# Patient Record
Sex: Male | Born: 1995 | Race: White | Hispanic: No | Marital: Single | Smoking: Never smoker
Health system: Southern US, Community
[De-identification: ages and names within clinical notes are randomized; demographics above are authoritative.]

## PROBLEM LIST (undated history)

## (undated) HISTORY — PX: TONSILLECTOMY: SUR1361

---
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
## 2017-03-09 ENCOUNTER — Other Ambulatory Visit: Payer: Self-pay | Admitting: Family Medicine

## 2017-03-09 ENCOUNTER — Ambulatory Visit (INDEPENDENT_AMBULATORY_CARE_PROVIDER_SITE_OTHER): Payer: Managed Care, Other (non HMO) | Admitting: Family Medicine

## 2017-03-09 ENCOUNTER — Ambulatory Visit (INDEPENDENT_AMBULATORY_CARE_PROVIDER_SITE_OTHER): Payer: Managed Care, Other (non HMO)

## 2017-03-09 ENCOUNTER — Encounter: Payer: Self-pay | Admitting: Family Medicine

## 2017-03-09 VITALS — BP 141/85 | HR 93 | Temp 99.0°F | Ht 71.0 in | Wt 194.0 lb

## 2017-03-09 DIAGNOSIS — R05 Cough: Secondary | ICD-10-CM

## 2017-03-09 DIAGNOSIS — R079 Chest pain, unspecified: Secondary | ICD-10-CM

## 2017-03-09 DIAGNOSIS — K209 Esophagitis, unspecified without bleeding: Secondary | ICD-10-CM

## 2017-03-09 MED ORDER — BENZONATATE 200 MG PO CAPS
200.0000 mg | ORAL_CAPSULE | Freq: Three times a day (TID) | ORAL | 0 refills | Status: DC | PRN
Start: 2017-03-09 — End: 2017-10-19

## 2017-03-09 MED ORDER — OMEPRAZOLE 40 MG PO CPDR: 40.0000 mg | DELAYED_RELEASE_CAPSULE | Freq: Every day | ORAL | 3 refills | Status: DC

## 2017-03-09 MED ORDER — SUCRALFATE 1 G PO TABS: 1.0000 g | ORAL_TABLET | Freq: Four times a day (QID) | ORAL | 0 refills | Status: DC

## 2017-03-09 NOTE — Progress Notes (Signed)
Raymond Steele is a 21 y.o. male who [name] today for chest pain.  Raymond Steele notes I 1 week history of cough and congestion.  He notes a several day history of central chest pain worse with swallowing.  He denies any significant exertional component or vomiting.  He denies any blood in sputum or vomitus.  He denies any dark tarry stools.  He denies fevers or chills or trouble breathing.  He feels well otherwise.  Tried some over-the-counter medications which have not helped much.   No past medical history on file. Past Surgical History:  Procedure Laterality Date  . TONSILLECTOMY     Social History   Tobacco Use  . Smoking status: Never Smoker  . Smokeless tobacco: Never Used  Substance Use Topics  . Alcohol use: No    Alcohol/week: 0.0 oz   family history includes Migraines in his mother.  ROS as above:  Medications: Current Outpatient Medications  Medication Sig Dispense Refill  . benzonatate (TESSALON) 200 MG capsule Take 1 capsule (200 mg total) by mouth 3 (three) times daily as needed for cough. 45 capsule 0  . omeprazole (PRILOSEC) 40 MG capsule Take 1 capsule (40 mg total) by mouth daily. 30 capsule 3  . sucralfate (CARAFATE) 1 g tablet Take 1 tablet (1 g total) by mouth 4 (four) times daily. 120 tablet 0   No current facility-administered medications for this visit.    Allergies  Allergen Reactions  . Zithromax [Azithromycin]     Health Maintenance Health Maintenance  Topic Date Due  . HIV Screening  02/26/2012  . TETANUS/TDAP  02/26/2016  . INFLUENZA VACCINE  10/26/2016     Exam:  BP (!) 141/85   Pulse 93   Temp 99 F (37.2 C) (Oral)   Ht 5\' 11"  (1.803 m)   Wt 194 lb (88 kg)   SpO2 95%   BMI 27.06 kg/m  Gen: Well NAD HEENT: EOMI,  MMM posterior pharynx with shallow ulcerations.  No cervical lymphadenopathy.   No subcutaneous crepitations in the neck or chest. Lungs: Normal work of breathing. CTABL Heart: RRR no MRG Abd: NABS, Soft. Nondistended, Nontender Exts: Brisk capillary refill, warm and well perfused.   Twelve-lead EKG shows normal sinus rhythm at 78 beats per minutes.  No ST segment elevation or depression.  Normal EKG.  Patient was given a GI cocktail and felt better.   No results found for this or any previous visit (from the past 72 hour(s)). Dg Chest 2 View  Result Date: 03/09/2017 CLINICAL DATA:  Central chest pain, cough for 5 days EXAM: CHEST  2 VIEW COMPARISON:  None. FINDINGS: Heart and mediastinal contours are within normal limits. No focal opacities or effusions. No acute bony abnormality. IMPRESSION: No active cardiopulmonary disease. Electronically Signed   By: Charlett NoseKevin  Dover M.D.   On: 03/09/2017 11:03      Assessment and Plan: 21 y.o. male with central chest pain worse with swallowing with ulcers present at the posterior pharynx is concerning for esophagitis possibly due to CMV.  Plan to check basic labs listed below and treat empirically with omeprazole and Carafate.  Will control cough with Tessalon Perles.  EKG and chest x-ray are unremarkable.   Orders Placed This Encounter  Procedures  . DG Chest 2 View    Order Specific Question:   Reason for exam:    Answer:   Central chest pain with swallow  and cough    Order Specific Question:   Preferred imaging [facility]?    [facility]  . COMPLETE METABOLIC PANEL WITH GFR  . CMV abs, IgG+IgM (cytomegalovirus)  . Epstein-Barr virus VCA antibody panel  . CBC with Differential/Platelet   Meds ordered this encounter  Medications  . omeprazole (PRILOSEC) 40 MG capsule    Sig: Take 1 capsule (40 mg total) by mouth daily.    Dispense:  30 capsule    Refill:  3  . sucralfate (CARAFATE) 1 g tablet    Sig: Take 1 tablet (1 g total) by mouth 4 (four) times daily.    Dispense:  120 tablet    Refill:   0  . benzonatate (TESSALON) 200 MG capsule    Sig: Take 1 capsule (200 mg total) by mouth 3 (three) times daily as needed for cough.    Dispense:  45 capsule    Refill:  0     Discussed warning signs or symptoms. Please see discharge instructions. Patient expresses understanding.

## 2017-03-09 NOTE — Patient Instructions (Addendum)
Thank you for coming in today. Get labs today.  Take omeprazole for acid suppression.  Take carafate 4x daily to coat your stomach.  Take tessalon pearles for cough as needed. Get labs now.   Recheck as needed.    Esophagitis Esophagitis is inflammation of the esophagus. The esophagus is the tube that carries food and liquids from your mouth to your stomach. Esophagitis can cause soreness or pain in the esophagus. This condition can make it difficult and painful to swallow. What are the causes? Most causes of esophagitis are not serious. Common causes of this condition include:  Gastroesophageal reflux disease (GERD). This is when stomach contents move back up into the esophagus (reflux).  Repeated vomiting.  An allergic-type reaction, especially caused by food allergies (eosinophilic esophagitis).  Injury to the esophagus by swallowing large pills with or without water, or swallowing certain types of medicines.  Swallowing (ingesting) harmful chemicals, such as household cleaning products.  Heavy alcohol use.  An infection of the esophagus.This most often occurs in people who have a weakened immune system.  Radiation or chemotherapy treatment for cancer.  Certain diseases such as sarcoidosis, Crohn disease, and scleroderma.  What are the signs or symptoms? Symptoms of this condition include:  Difficult or painful swallowing.  Pain with swallowing acidic liquids, such as citrus juices.  Pain with burping.  Chest pain.  Difficulty breathing.  Nausea.  Vomiting.  Pain in the abdomen.  Weight loss.  Ulcers in the mouth.  Patches of white material in the mouth (candidiasis).  Fever.  Coughing up blood or vomiting blood.  Stool that is black, tarry, or bright red.  How is this diagnosed? Your health care provider will take a medical history and perform a physical exam. You may also have other tests, including:  An endoscopy to examine your stomach and  esophagus with a small camera.  A test that measures the acidity level in your esophagus.  A test that measures how much pressure is on your esophagus.  A barium swallow or modified barium swallow to show the shape, size, and functioning of your esophagus.  Allergy tests.  How is this treated? Treatment for this condition depends on the cause of your esophagitis. In some cases, steroids or other medicines may be given to help relieve your symptoms or to treat the underlying cause of your condition. You may have to make some lifestyle changes, such as:  Avoiding alcohol.  Quitting smoking.  Changing your diet.  Exercising.  Changing your sleep habits and your sleep environment.  Follow these instructions at home: Take these actions to decrease your discomfort and to help avoid complications. Diet  Follow a diet as recommended by your health care provider. This may involve avoiding foods and drinks such as: ? Coffee and tea (with or without caffeine). ? Drinks that contain alcohol. ? Energy drinks and sports drinks. ? Carbonated drinks or sodas. ? Chocolate and cocoa. ? Peppermint and mint flavorings. ? Garlic and onions. ? Horseradish. ? Spicy and acidic foods, including peppers, chili powder, curry powder, vinegar, hot sauces, and barbecue sauce. ? Citrus fruit juices and citrus fruits, such as oranges, lemons, and limes. ? Tomato-based foods, such as red sauce, chili, salsa, and pizza with red sauce. ? Fried and fatty foods, such as donuts, french fries, potato chips, and high-fat dressings. ? High-fat meats, such as hot dogs and fatty cuts of red and white meats, such as rib eye steak, sausage, ham, and bacon. ? High-fat dairy  items, such as whole milk, butter, and cream cheese.  Eat small, frequent meals instead of large meals.  Avoid drinking large amounts of liquid with your meals.  Avoid eating meals during the 2-3 hours before bedtime.  Avoid lying down right  after you eat.  Do not exercise right after you eat.  Avoid foods and drinks that seem to make your symptoms worse. General instructions  Pay attention to any changes in your symptoms.  Take over-the-counter and prescription medicines only as told by your health care provider. Do not take aspirin, ibuprofen, or other NSAIDs unless your health care provider told you to do so.  If you have trouble taking pills, use a pill splitter to decrease the size of the pill. This will decrease the chance of the pill getting stuck or injuring your esophagus on the way down. Also, drink water after you take a pill.  Do not use any tobacco products, including cigarettes, chewing tobacco, and e-cigarettes. If you need help quitting, ask your health care provider.  Wear loose-fitting clothing. Do not wear anything tight around your waist that causes pressure on your abdomen.  Raise (elevate) the head of your bed about 6 inches (15 cm).  Try to reduce your stress, such as with yoga or meditation. If you need help reducing stress, ask your health care provider.  If you are overweight, reduce your weight to an amount that is healthy for you. Ask your health care provider for guidance about a safe weight loss goal.  Keep all follow-up visits as told by your health care provider. This is important. Contact a health care provider if:  You have new symptoms.  You have unexplained weight loss.  You have difficulty swallowing, or it hurts to swallow.  You have wheezing or a persistent cough.  Your symptoms do not improve with treatment.  You have frequent heartburn for more than two weeks. Get help right away if:  You have severe pain in your arms, neck, jaw, teeth, or back.  You feel sweaty, dizzy, or light-headed.  You have chest pain or shortness of breath.  You vomit and your vomit looks like blood or coffee grounds.  Your stool is bloody or black.  You have a fever.  You cannot swallow,  drink, or eat. This information is not intended to replace advice given to you by your health care provider. Make sure you discuss any questions you have with your health care provider. Document Released: 04/21/2004 Document Revised: 08/20/2015 Document Reviewed: 07/09/2014 Elsevier Interactive Patient Education  [employer]2018 [employer]

## 2017-03-11 LAB — CBC WITH DIFFERENTIAL/PLATELET
Basophils Absolute: 0.1 10*3/uL (ref 0.0–0.2)
Basos: 1 %
EOS (ABSOLUTE): 0.1 10*3/uL (ref 0.0–0.4)
Eos: 1 %
HEMATOCRIT: 44.5 % (ref 37.5–51.0)
Hemoglobin: 14.8 g/dL (ref 13.0–17.7)
Immature Grans (Abs): 0.1 10*3/uL (ref 0.0–0.1)
Immature Granulocytes: 1 %
LYMPHS ABS: 2.7 10*3/uL (ref 0.7–3.1)
Lymphs: 24 %
MCH: 29.2 pg (ref 26.6–33.0)
MCHC: 33.3 g/dL (ref 31.5–35.7)
MCV: 88 fL (ref 79–97)
MONOS ABS: 1.2 10*3/uL — AB (ref 0.1–0.9)
Monocytes: 11 %
Neutrophils Absolute: 7.2 10*3/uL — ABNORMAL HIGH (ref 1.4–7.0)
Neutrophils: 62 %
Platelets: 266 10*3/uL (ref 150–379)
RBC: 5.06 x10E6/uL (ref 4.14–5.80)
RDW: 14 % (ref 12.3–15.4)
WBC: 11.3 10*3/uL — AB (ref 3.4–10.8)

## 2017-03-11 LAB — COMPREHENSIVE METABOLIC PANEL
A/G RATIO: 1.7 (ref 1.2–2.2)
ALT: 23 IU/L (ref 0–44)
AST: 23 IU/L (ref 0–40)
Albumin: 4.4 g/dL (ref 3.5–5.5)
Alkaline Phosphatase: 77 IU/L (ref 39–117)
BUN / CREAT RATIO: 10 (ref 9–20)
BUN: 9 mg/dL (ref 6–20)
Bilirubin Total: 0.6 mg/dL (ref 0.0–1.2)
CALCIUM: 9.3 mg/dL (ref 8.7–10.2)
CO2: 24 mmol/L (ref 20–29)
Chloride: 102 mmol/L (ref 96–106)
Creatinine, Ser: 0.94 mg/dL (ref 0.76–1.27)
GFR calc non Af Amer: 116 mL/min/{1.73_m2} (ref >59)
GFR, EST AFRICAN AMERICAN: 134 mL/min/{1.73_m2} (ref >59)
GLOBULIN, TOTAL: 2.6 g/dL (ref 1.5–4.5)
Glucose: 75 mg/dL (ref 65–99)
Potassium: 4.4 mmol/L (ref 3.5–5.2)
SODIUM: 142 mmol/L (ref 134–144)
TOTAL PROTEIN: 7 g/dL (ref 6.0–8.5)

## 2017-03-11 LAB — CMV ANTIBODY, IGG (EIA): CMV Ab - IgG: <0.6 U/mL (ref 0.00–0.59)

## 2017-03-11 LAB — SPECIMEN STATUS REPORT

## 2017-03-11 LAB — EBV VCA/EA AB, IGG
EBV Early Antigen Ab, IgG: <9 U/mL (ref 0.0–8.9)
EBV VCA IgG: 292 U/mL — ABNORMAL HIGH (ref 0.0–17.9)

## 2017-03-11 LAB — CMV IGM: CMV IgM Ser EIA-aCnc: <30 AU/mL (ref 0.0–29.9)

## 2017-03-17 NOTE — Addendum Note (Signed)
Addended by: Collie SiadICHARDSON, Lively Haberman M on: 03/17/2017 09:40 AM   Modules accepted: Orders

## 2017-10-19 ENCOUNTER — Encounter: Payer: Self-pay | Admitting: Family Medicine

## 2017-10-19 ENCOUNTER — Ambulatory Visit (INDEPENDENT_AMBULATORY_CARE_PROVIDER_SITE_OTHER): Payer: Managed Care, Other (non HMO)

## 2017-10-19 ENCOUNTER — Ambulatory Visit (INDEPENDENT_AMBULATORY_CARE_PROVIDER_SITE_OTHER): Payer: Managed Care, Other (non HMO) | Admitting: Family Medicine

## 2017-10-19 VITALS — BP 142/86 | HR 97 | Wt 205.0 lb

## 2017-10-19 DIAGNOSIS — Z23 Encounter for immunization: Secondary | ICD-10-CM | POA: Diagnosis not present

## 2017-10-19 DIAGNOSIS — S63091A Other subluxation of right wrist and hand, initial encounter: Secondary | ICD-10-CM | POA: Diagnosis not present

## 2017-10-19 DIAGNOSIS — M25531 Pain in right wrist: Secondary | ICD-10-CM | POA: Diagnosis not present

## 2017-10-19 MED ORDER — DICLOFENAC SODIUM 1 % TD GEL: 2.0000 g | Freq: Four times a day (QID) | TRANSDERMAL | 11 refills | Status: AC

## 2017-10-19 NOTE — Patient Instructions (Addendum)
Thank you for coming in today. Use a wrist compression.  I recommend Body Helix full wrist.  You can also get a wrist wrap at a sporting good store or a medical supply store.   Use the diclofenac gel.   Recheck in 1 month.  Return sooner if needed.      Extensor Carpi Ulnaris Instability Tendons attach muscles to bones. They also help with joint movements. When a tendon is injured or it moves out of its normal position, the joint movements can be affected. The extensor carpi ulnaris (ECU) tendon is located on the back of the wrist, on the side that is closest to the little finger. Itruns under a band of fibrous tissue (extensor retinaculum) to stabilize its position in the wrist. This tendonhelps with extending or bending back your wrist and angling your wrist toward your body. It also helps with gripping and pulling items close to your body. Injury to the ECU may result in ECU instability and hand and wrist weakness. Damage to the retinaculum causes the ECU tendon to slip in and out of the bony groove (subluxation) that it normally lies in or to come out of the groove completely (dislocation). What are the causes? This condition often happens with repeated activities that require forceful rotation or extension of the wrist. Other causes of ECU instability include:  Forced (traumatic) rotation of the wrist to a palm-up position.  A shallow or malformed groove in the bone where the ECU normally lies.  What increases the risk? This condition is more likely to develop in:  People who play sports that include repeated, forceful turning up of the wrist into a palm-up position, such as tennis, golf, and weight lifting.  People who have had a previous wrist injury.  People who have poor wrist strength and flexibility.  People who do not warm up properly before activities.  What are the signs or symptoms? Symptoms of this condition include:  Painful or painless "snapping" feeling over  the back of the wrist-on the little finger side-when you rotate your wrist into the palm-up position.  Swelling of your injured wrist.  Pain when the injured area is touched.  Weakness in your wrist when you turn it to the palm-up position.  How is this diagnosed? This condition is diagnosed with a medical history and physical exam. You may also have imaging studies to confirm the diagnosis. These can include:  Ultrasound.  MRI.  How is this treated? Treatment may include the use of icing and medicines to reduce pain and swelling.You may also be advised to wear a splint or brace to limit your wrist motion. In less severe cases, treatment may also include working with a physical therapist to strengthen your wrist and stabilize your ECU. In severe cases, surgery may be needed. Follow these instructions at home: If you have a splint or brace:  Wear it as told by your health care provider. Remove it only as told by your health care provider.  Loosen the splint or brace if your fingers become numb and tingle, or if they turn cold and blue.  Keep the splint or brace clean and dry. Managing pain, stiffness, and swelling  If directed, apply ice to the injured area. ? Put ice in a plastic bag. ? Place a towel between your skin and the bag. ? Leave the ice on for 20 minutes, 2-3 times per day.  Move your fingers often to avoid stiffness and to lessen swelling.  Raise (elevate)  the injured area above the level of your heart while you are sitting or lying down. General instructions  Return to your normal activities as told by your health care provider. Ask your health care provider what activities are safe for you.  Take over-the-counter and prescription medicines only as told by your health care provider.  Keep all follow-up visits as told by your health care provider. This is important.  Do not drive or operate heavy machinery while taking prescription pain medicine. Contact a  health care provider if:  Your pain, tenderness, or swelling gets worse, even if you have had treatment.  You have numbness or tingling in your wrist, hand, or fingers on the injured side. This information is not intended to replace advice given to you by your health care provider. Make sure you discuss any questions you have with your health care provider. Document Released: 03/14/2005 Document Revised: 08/20/2015 Document Reviewed: 05/16/2014 Elsevier Interactive Patient Education  2018 [employer].   Extensor Carpi Ulnaris Tendinitis Rehab Ask your health care provider which exercises are safe for you. Do exercises exactly as told by your health care provider and adjust them as directed. It is normal to feel mild stretching, pulling, tightness, or discomfort as you do these exercises, but you should stop right away if you feel sudden pain or your pain gets worse. Do not begin these exercises until told by your health care provider. Stretching and range of motion exercises These exercises warm up your muscles and joints and improve the movement and flexibility of your forearm. These exercises also help to relieve pain, numbness, and tingling. Exercise A: Extensor stretch 1. Extend your __________ arm in front of you, and point your fingers downward. 2. Gently pull the palm of your __________ hand toward you until you feel a gentle stretch on the top of your forearm and wrist. 3. Hold this position for __________ seconds. 4. Slowly return to the starting position. Repeat __________ times with your elbow straight and __________ times with your elbow bent. Complete this exercises __________ times a day. Exercise B: Wrist flexor stretch  1. Stand over a tabletop with your __________ hand resting on the tabletop and your fingers pointing away from your body. Your arm should be extended, and there should be a slight bend in your elbow. 2. Gently press the back of your hand down onto the table by  straightening your elbow. You should feel a stretch in the top of your forearm. 3. Hold this position for __________ seconds. 4. Slowly return to the starting position. Repeat __________ times. Complete this exercise __________ times a day. Strengthening exercises These exercises build strength and endurance in your forearm. Endurance is the ability to use your muscles for a long time, even after they get tired. Exercise C: Wrist extension 1. Sit with your __________ forearm supported on a table and your hand resting palm-down over the edge of the table. 2. Hold a __________ weight in your __________ hand. Or, hold a rubber exercise band or tube in both hands. If you are holding a band or tube, take up any slack with your other hand so there is a slight tension in the exercise band or tube when you start. 3. Slowly move the back of your hand up toward your forearm. 4. Hold this position for __________ seconds. 5. Slowly lower your hand to the starting position. Repeat __________ times. Complete this exercise __________ times a day. Exercise D: Ulnar deviation 1. Sit with your __________  forearm supported. Your thumb should be pointing upward, and your hand should be able to move down over the table edge. 2. Hold your __________ arm in front of you and hold a rubber exercise band or tube between your hands. There should be a slight tension in the exercise band or tube when you start. 3. Move your injured wrist so your pinkie travels toward the floor. Try to only move your hand and wrist and keep the rest of your arm still. 4. Hold this position for __________ seconds. 5. Slowly return your wrist to the starting position. Repeat __________ times. Complete this exercise __________ times a day. Exercise E: Ulnar deviation, eccentric 1. Sit with your __________ forearm supported. Your thumb should be pointing upward, and your hand should be able to move down over the table edge. 2. Hold your  __________ arm in front of you and hold a rubber exercise band or tube between your hands. Do not put any tension on the exercise band or tube yet. 3. Move your __________ wrist so your pinkie travels toward the floor. 4. Add tension to the band or tube by pulling it with your __________ hand. 5. Hold this position for __________ seconds. 6. Slowly return to the starting position, controlling the speed with your __________ hand and wrist. Your hand will move toward the ceiling, thumb first. Try to move only your hand and wrist and keep the rest of your arm still. Repeat __________ times. Complete this exercise __________ times a day. This information is not intended to replace advice given to you by your health care provider. Make sure you discuss any questions you have with your health care provider. Document Released: 03/14/2005 Document Revised: 11/17/2015 Document Reviewed: 11/28/2014 Elsevier Interactive Patient Education  [employer]2018 [employer].

## 2017-10-20 ENCOUNTER — Encounter: Payer: Self-pay | Admitting: Family Medicine

## 2017-10-20 DIAGNOSIS — S63091A Other subluxation of right wrist and hand, initial encounter: Secondary | ICD-10-CM | POA: Insufficient documentation

## 2017-10-20 NOTE — Progress Notes (Signed)
Raymond Steele is a 22 y.o. male who [facility] today for right wrist pain.  Raymond MaduroRobert notes an approximate 6-week history of right wrist pain.  He is currently working as a Public relations account executivelifeguard but cannot think of any injury or significant activity change that would cause the wrist pain.  He points to the dorsal ulnar wrist as the area of maximal pain and notes that wrist extension is painful.  He notes that he is unable to do a push-up position and have force across his extended hand as it is painful.  He denies any radiating pain weakness or numbness fevers or chills.  He has tried some oral ibuprofen like medications which did not help much.  He is tried using an over-the-counter wrist brace but notes is not very helpful because he cannot use it at work.  No fevers or chills nausea vomiting or diarrhea.    ROS:  As above  Exam:  BP (!) 142/86   Pulse 97   Wt 205 lb (93 kg)   BMI 28.59 kg/m  General: Well Developed, well nourished, and in no acute distress.  Neuro/Psych: Alert and oriented x3, extra-ocular muscles intact, able to move all 4 extremities, sensation grossly intact. Skin: Warm and dry, no rashes noted.  Respiratory: Not using accessory muscles, speaking in full sentences, trachea midline.  Cardiovascular: Pulses palpable, no extremity edema. Abdomen: Does not appear distended. MSK:  Right wrist normal-appearing without deformity erythema or ecchymosis. Tender to palpation along the ulnar styloid. Wrist motion is intact but patient has pain with wrist extension and ulnar deviation. Strength is intact again pain is worsened with resisted ulnar deviation and wrist extension. Painful palpable click and snap of the extensor carpi ulnaris tendon across ulnar styloid present with some wrist motions. Pulses capillary refill and sensation are intact distally.    Lab and Radiology Results No results found for this or any  previous visit (from the past 72 hour(s)). Dg Wrist Complete Right  Result Date: 10/19/2017 CLINICAL DATA:  Ulnar wrist pain for several weeks, no known injury, initial encounter EXAM: RIGHT WRIST - COMPLETE 3+ VIEW COMPARISON:  None. FINDINGS: There is no evidence of fracture or dislocation. There is no evidence of arthropathy or other focal bone abnormality. Soft tissues are unremarkable. IMPRESSION: No acute abnormality noted. Electronically Signed   By: Alcide CleverMark  Lukens M.D.   On: 10/19/2017 16:23  I personally (independently) visualized and performed the interpretation of the images attached in this note.      Assessment and Plan: 22 y.o. 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  Plan for diclofenac gel home exercise program and compressive wrist sleeve.  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  Recheck in a few weeks.  Return sooner if needed.  Tdap given prior to discharge.  Blood pressure elevated today.  Follow-up in the near future to address blood pressure.    Orders Placed This Encounter  Procedures  . DG Wrist Complete Right    Standing Status:   Future    Number of Occurrences:   1    Standing Expiration Date:   12/21/2018    Order Specific Question:   Reason for Exam (SYMPTOM  OR DIAGNOSIS REQUIRED)    Answer:   WRIST PAIN X3 WEEKS    Order Specific Question:   Preferred imaging location?    [facility]    Order Specific Question:  Radiology Contrast Protocol - do NOT remove file path    Answer:   [email or web address]  . Tdap vaccine greater than or equal to 7yo IM   Meds ordered this encounter  Medications  . diclofenac sodium (VOLTAREN) 1 % GEL    Sig: Apply 2 g topically 4 (four) times daily. To affected joint.    Dispense:  100 g    Refill:  11    Historical information moved to improve visibility of documentation.  History reviewed. No pertinent past  medical history. Past Surgical History:  Procedure Laterality Date  . TONSILLECTOMY     Social History   Tobacco Use  . Smoking status: Never Smoker  . Smokeless tobacco: Never Used  Substance Use Topics  . Alcohol use: No    Alcohol/week: 0.0 oz   family history includes Migraines in his mother.  Medications: Current Outpatient Medications  Medication Sig Dispense Refill  . cimetidine (TAGAMET) 400 MG tablet Take 400 mg by mouth 3 (three) times daily.    . diclofenac sodium (VOLTAREN) 1 % GEL Apply 2 g topically 4 (four) times daily. To affected joint. 100 g 11   No current facility-administered medications for this visit.    Allergies  Allergen Reactions  . Zithromax [Azithromycin]       Discussed warning signs or symptoms. Please see discharge instructions. Patient expresses understanding.

## 2017-11-21 ENCOUNTER — Ambulatory Visit: Payer: Managed Care, Other (non HMO) | Admitting: Family Medicine

## 2019-02-19 IMAGING — DX DG WRIST COMPLETE 3+V*R*
4 series · 4 of 4 positions shown · non-contrast
Comparison: None.

CLINICAL DATA: Ulnar wrist pain for several weeks, no known injury,
initial encounter

EXAM:
RIGHT WRIST - COMPLETE 3+ VIEW

[wrist pa]
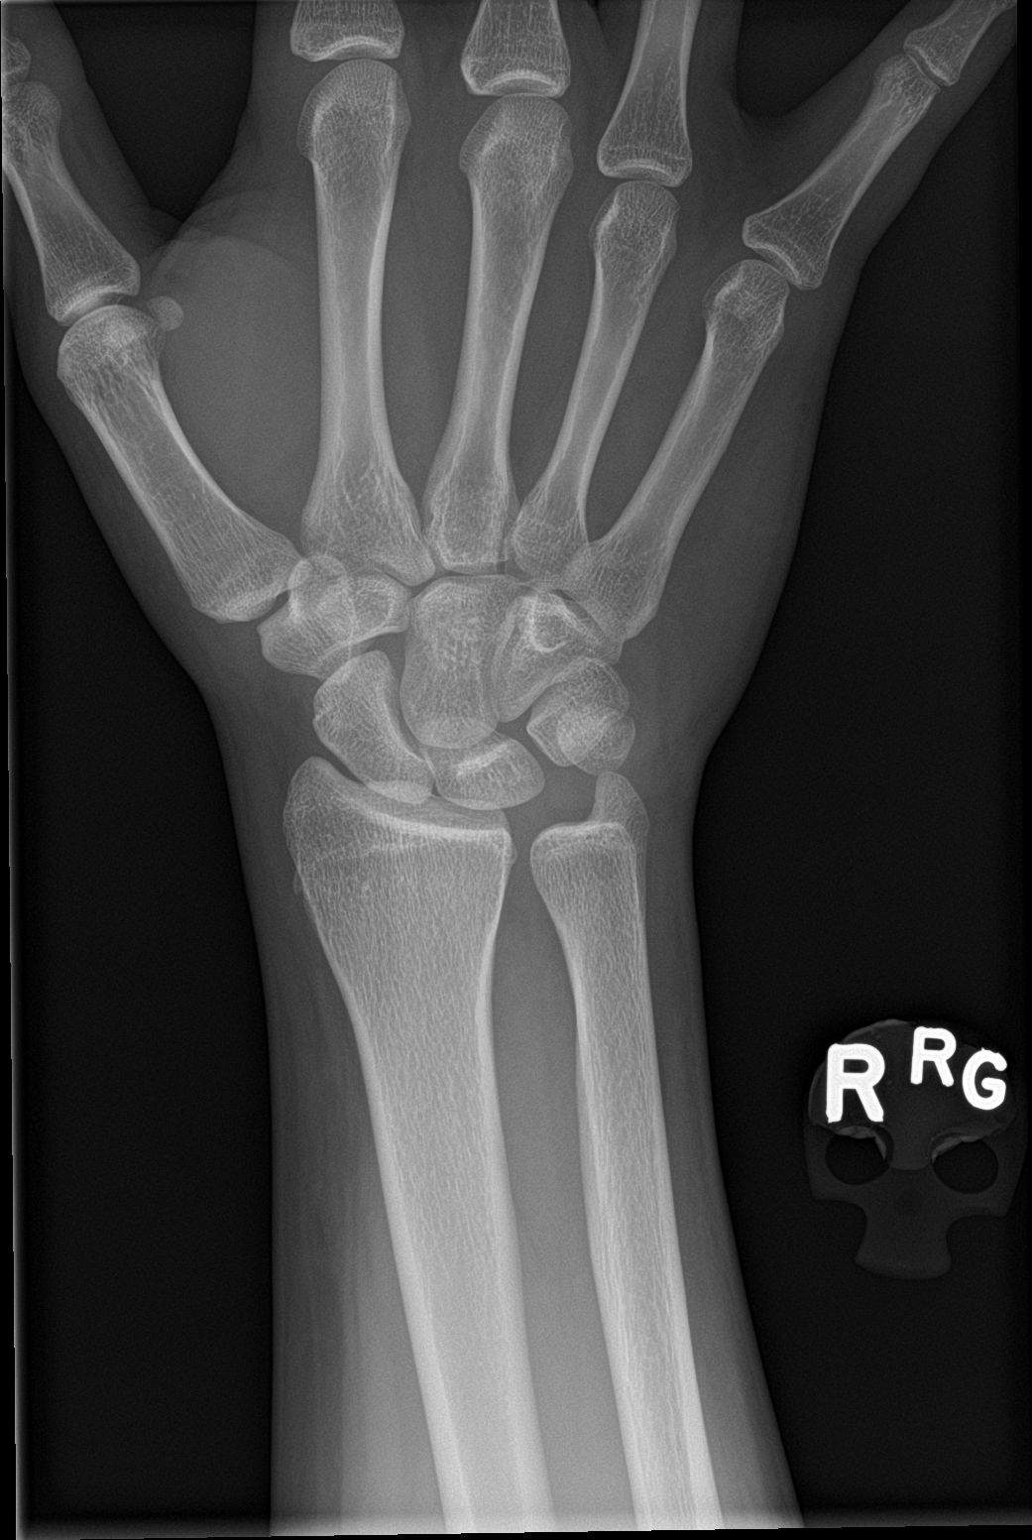

[wrist obl]
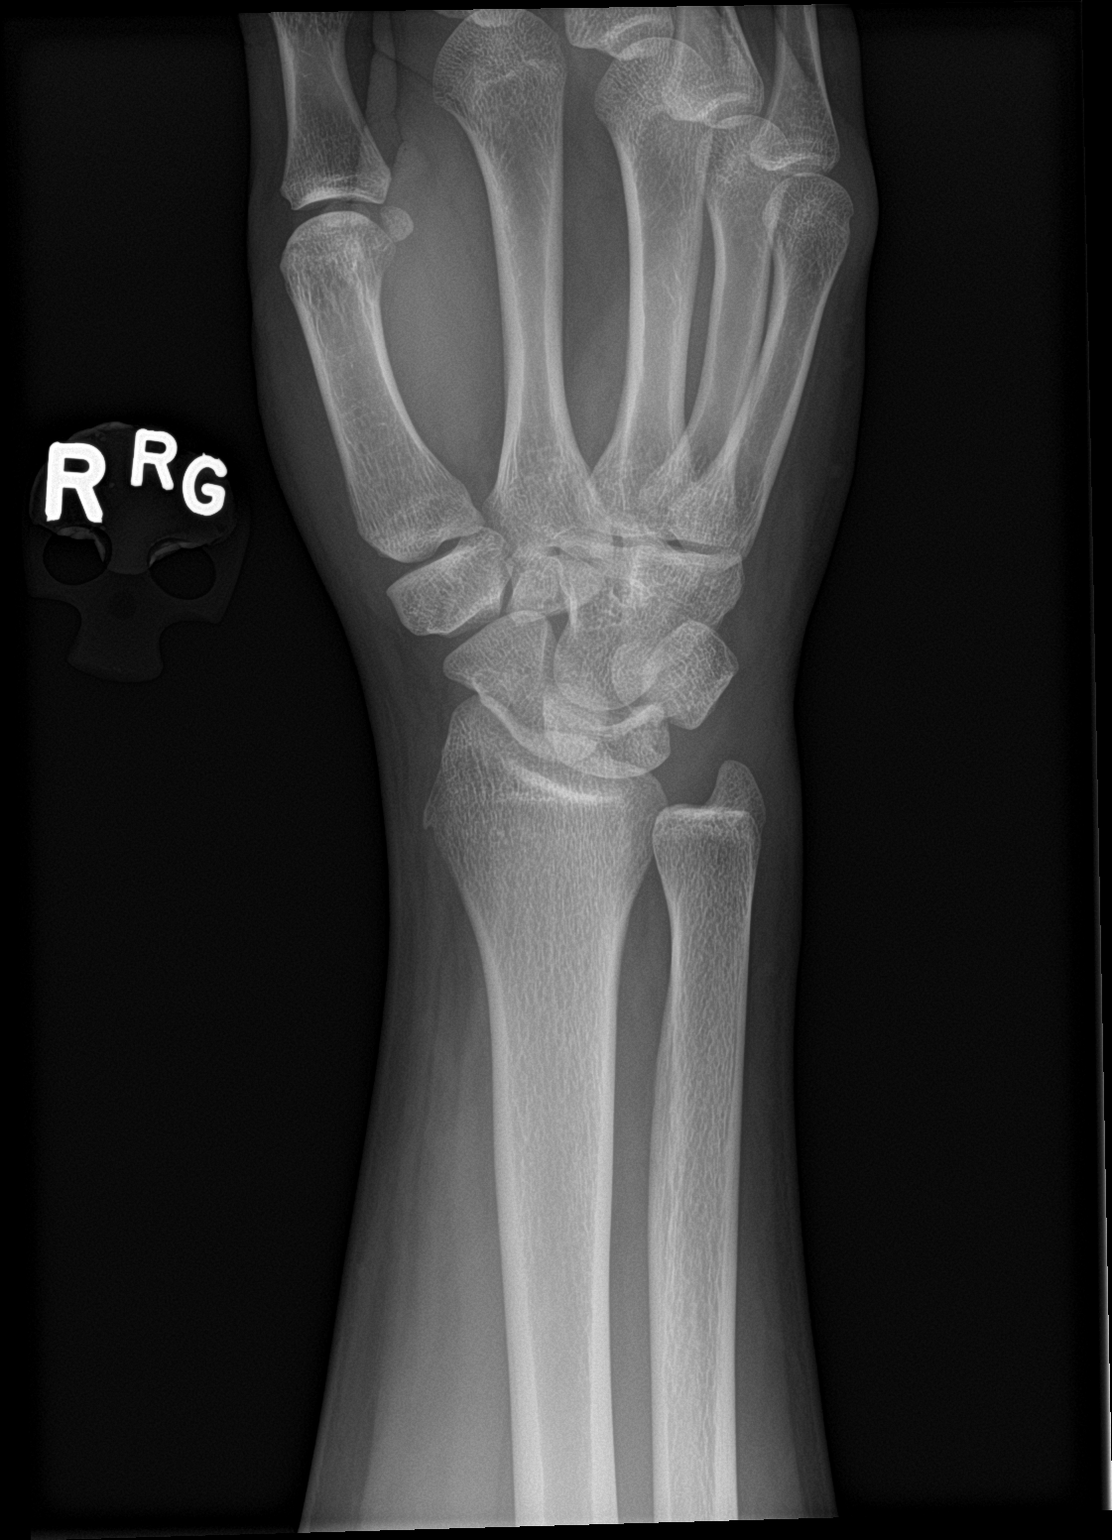

[wrist lat]
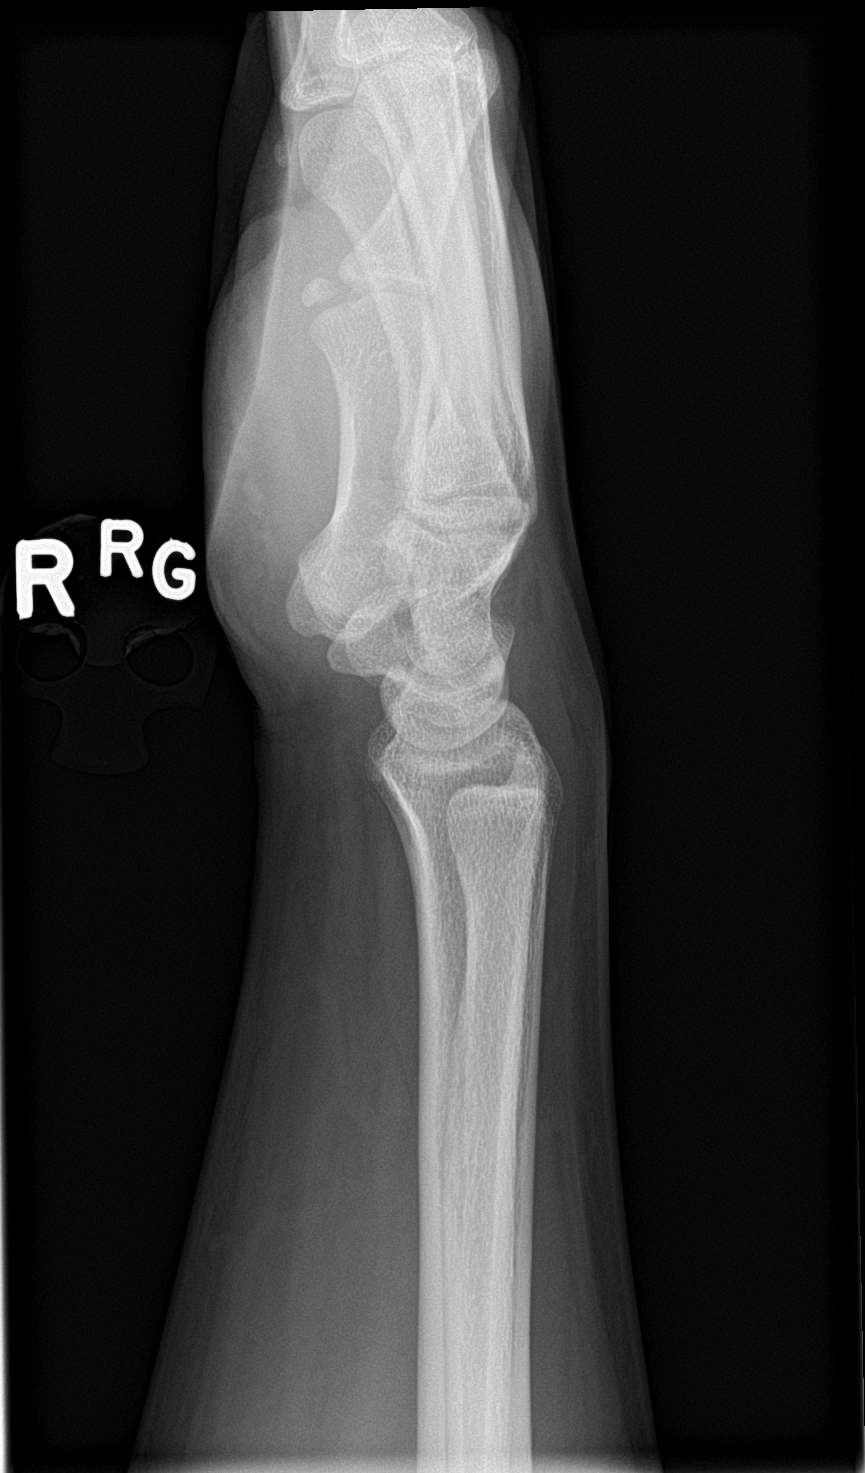

[wrist navicular]
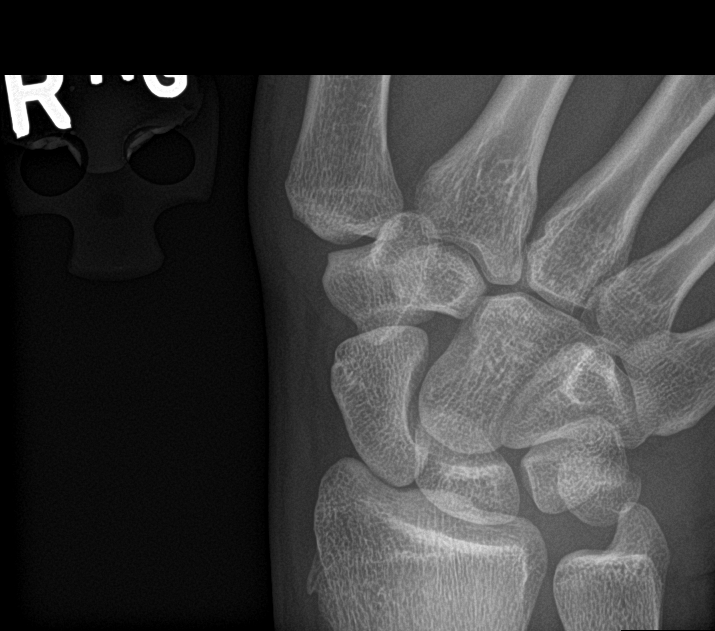

[4 of 4 positions shown; findings below may reference images not displayed]

FINDINGS: There is no evidence of fracture or dislocation. There is no
evidence of arthropathy or other focal bone abnormality. Soft
tissues are unremarkable.
IMPRESSION: No acute abnormality noted.
